# Patient Record
Sex: Male | Born: 1970 | Race: Black or African American | Hispanic: No | Marital: Married | Smoking: Never smoker
Health system: Southern US, Community
[De-identification: ages and names within clinical notes are randomized; demographics above are authoritative.]

## PROBLEM LIST (undated history)

## (undated) DIAGNOSIS — Z973 Presence of spectacles and contact lenses: Secondary | ICD-10-CM

## (undated) DIAGNOSIS — E785 Hyperlipidemia, unspecified: Secondary | ICD-10-CM

## (undated) DIAGNOSIS — G4700 Insomnia, unspecified: Secondary | ICD-10-CM

## (undated) DIAGNOSIS — I10 Essential (primary) hypertension: Secondary | ICD-10-CM

## (undated) HISTORY — DX: Hyperlipidemia, unspecified: E78.5

## (undated) HISTORY — DX: Essential (primary) hypertension: I10

## (undated) HISTORY — DX: Presence of spectacles and contact lenses: Z97.3

## (undated) HISTORY — DX: Insomnia, unspecified: G47.00

---

## 1998-01-16 ENCOUNTER — Encounter: Payer: Self-pay | Admitting: Internal Medicine

## 1998-01-16 ENCOUNTER — Inpatient Hospital Stay (HOSPITAL_COMMUNITY): Admission: EM | Admit: 1998-01-16 | Discharge: 1998-01-17 | Payer: Self-pay | Admitting: Emergency Medicine

## 2005-10-28 ENCOUNTER — Encounter: Admission: RE | Admit: 2005-10-28 | Discharge: 2005-10-28 | Payer: Self-pay | Admitting: Occupational Medicine

## 2007-05-10 ENCOUNTER — Inpatient Hospital Stay (HOSPITAL_COMMUNITY): Admission: EM | Admit: 2007-05-10 | Discharge: 2007-05-13 | Payer: Self-pay | Admitting: Emergency Medicine

## 2007-05-10 HISTORY — PX: INCISION AND DRAINAGE: SHX5863

## 2007-05-12 HISTORY — PX: INCISION AND DRAINAGE OF WOUND: SHX1803

## 2010-08-27 NOTE — Consult Note (Signed)
Patrick Werner                 ACCOUNT NO.:  [id number]   MEDICAL RECORD NO.:  [id number]          PATIENT TYPE:  INP   LOCATION:  2550                         FACILITY:  [facility]   PHYSICIAN:  Artist Pais. Weingold, M.D.DATE OF BIRTH:  April 17, 1970   DATE OF CONSULTATION:  05/10/2007  DATE OF DISCHARGE:                                 CONSULTATION   REASON FOR CONSULTATION:  Infected left thumb.   HISTORY OF PRESENT ILLNESS:  '  [name] is a 40 year old right-hand dominant male who presets  here with a 48-hour history of pain and swelling along the extensor  sheath and metacarpal phalangeal joint of his nondominant left thumb and  with a positive history of an insect bite/laceration in the last 48 to  72 hours.  Otherwise healthy 40 year old male.   ALLERGIES:  No known drug allergies.   MEDICATIONS:  His only medication right now is Lipitor for  hypercholesterolemia.   __________   FAMILY MEDICAL HISTORY:  Noncontributory.   SOCIAL HISTORY:  Noncontributory.   PHYSICAL EXAMINATION:  GENERAL:  A well-nourished male.  Pleasant and  alert and oriented times 3.  EXTREMITIES:  Examination of his hand on the left, he has pain and  swelling in the area of the mid metacarpal out to the tip of the thumb  just proximal to the eponychial fold.  He has no pain of the flexor  sheath but his metacarpal phalangeal joint is painful and swollen as  well as the entire extensor sheath.  He had an attempted I and D done at  that facility where a small amount of pus was encountered but he clearly  has an infected extensor sheath and probable septic metacarpal  phalangeal joint.  X-rays by history are negative from the outside  facility.  He is otherwise healthy.  No other significant findings.   IMPRESSION:  A 40 year old male with probable septic metacarpal  phalangeal joint, nondominant left thumb as well as an infected extensor  sheath.   RECOMMENDATIONS:  At this point in time,  will take him to the operating  room for I and D, culturing.  Will start him on vancomycin for a  presumptive methicillin resistant staphylococcus infection and will  probably do repeat wash outs as necessary.  He will be admitted for IV  antibiotics.  He understands the need for this course of action  emergently and again will take him to the operating room as soon as  possible __________  procedure.     Artist Pais Mina Marble, M.D.  Electronically Signed    MAW/MEDQ  D:  05/10/2007  T:  05/11/2007  Job:  [id number]

## 2010-08-27 NOTE — Op Note (Signed)
NAMERUSHIL, KIMBRELL                 ACCOUNT NO.:  [id number]   MEDICAL RECORD NO.:  [id number]          PATIENT TYPE:  INP   LOCATION:  2550                         FACILITY:  [facility]   PHYSICIAN:  Artist Pais. Weingold, M.D.DATE OF BIRTH:  [birth year]   DATE OF PROCEDURE:  05/10/2007  DATE OF DISCHARGE:                               OPERATIVE REPORT   PREOPERATIVE DIAGNOSIS:  Left thumb infection.   POSTOPERATIVE DIAGNOSIS:  Left thumb infection.   PROCEDURE:  Incision and drainage left thumb extensor sheath and  metacarpophalangeal joint.   SURGEON:  Artist Pais. Mina Marble, M.D.   ASSISTANT:  None.   ANESTHESIA:  General.   TOURNIQUET TIME:  16 minutes.   COMPLICATIONS:  None.   DRAINS:  None.   PACKS:  Wound was packed open with 1/4-inch Iodoform.   SPECIMEN:  Cultures x2 were sent.   OPERATIVE REPORT:  The patient taken to operating suite after the  induction of adequate general anesthesia.  The left upper extremity was  prepped and draped in the usual sterile fashion.  An Esmarch was used to  exsanguinate the limb.  The tourniquet was then inflated to 250 mm.  At  this point in time, a midline incision was made in the left thumb from  the IP joint to the level of the proximal aspect of the metacarpal.  Skin was incised.  Purulence was encountered over the extensor sheath.  This was cultured for both aerobic and anaerobic bacteria.  The sheath  was completely debrided.  A small incision was made on the ulnar side of  the metacarpophalangeal joint.  The joint was entered dorsoulnarly and  there was purulence inside the joint.  All devitalized tissue was  removed with a rongeur.  The wound was then thoroughly irrigated with a  3-liter pulse lavage irrigation.  It was then packed open with 1/4-inch  Iodoform gauze and secured with 2 or 3 nylon sutures to approximate the  wound edges.  Then 4 x 4's, fluffs and a compression wrap was applied.  The patient tolerated well, went to  recovery in stable fashion.     Artist Pais Mina Marble, M.D.  Electronically Signed    MAW/MEDQ  D:  05/10/2007  T:  05/11/2007  Job:  [id number]

## 2010-08-27 NOTE — Op Note (Signed)
NAMETREYVION, Werner                 ACCOUNT NO.:  [id number]   MEDICAL RECORD NO.:  [id number]          PATIENT TYPE:  INP   LOCATION:  5039                         FACILITY:  [facility]   PHYSICIAN:  Artist Pais. Weingold, M.D.DATE OF BIRTH:  08-Jun-1970   DATE OF PROCEDURE:  05/12/2007  DATE OF DISCHARGE:                               OPERATIVE REPORT   PREOPERATIVE DIAGNOSIS:  Left thumb septic metacarpal phalangeal joint  and extensor sheath infection.   POSTOPERATIVE DIAGNOSIS:  Left thumb septic metacarpal phalangeal joint  and extensor sheath infection.   PROCEDURE:  Repeat irrigation and debridement with secondary wound  closure.   SURGEON:  Artist Pais. Mina Marble, M.D.   ASSISTANT:  None.   ANESTHESIA:  General.   TOURNIQUET TIME:  17 minutes.   COMPLICATIONS:  None.   DRAINS:  None.   OPERATIVE REPORT:  The patient was taken to the operating suite, after  the induction of adequate general anesthesia, the left upper extremity  is prepped and draped in the usual sterile fashion.  An Esmarch was used  to exsanguinate the limb.  The tourniquet was inflated to 250 mmHg.  At  this point in time, the patient's old sutures were removed.  A midline  incision over the left thumb, which went basically from the IP joint to  the middle of the metacarpal area, was debrided using a rongeur.  All  devitalized tissue was removed.  At this point in time, 3 liters normal  saline was used to irrigate the wound using the pulse lavage irrigator.  After this was completed, the wound was loosely closed with 4-0 nylon.  4x4s, fluffs and a compressive wrap was applied.  The patient tolerated  the procedure well and went to the recovery room in stable condition.      Artist Pais Mina Marble, M.D.  Electronically Signed     MAW/MEDQ  D:  05/12/2007  T:  05/12/2007  Job:  [id number]

## 2010-08-30 NOTE — Discharge Summary (Signed)
NAMELEOTHA, VOELTZ                 ACCOUNT NO.:  [id number]   MEDICAL RECORD NO.:  [id number]          PATIENT TYPE:  INP   LOCATION:  5039                         FACILITY:  [facility]   PHYSICIAN:  Artist Pais. Weingold, M.D.DATE OF BIRTH:  1970/07/28   DATE OF ADMISSION:  05/10/2007  DATE OF DISCHARGE:  05/13/2007                               DISCHARGE SUMMARY   PRINCIPLE DIAGNOSIS:  Left thumb infection.   SECONDARY DIAGNOSIS:  Hypercholesteremia.   PRINCIPLE PROCEDURES:  Incision and drainage, left thumb x2 as well as  culturing.   HISTORY:  Mr. Bazar is a 40 year old right hand dominant male who  presented to the emergency department with a history of left thumb pain  and swelling with increasing pain forty-eight hours prior to admission.   PAST MEDICAL HISTORY:  Significant for hypercholesteremia.   FAMILY HISTORY:  Noncontributory.   SOCIAL HISTORY:  Noncontributory.   ALLERGIES:  No known drug allergies.   CURRENT MEDICATIONS:  Lipitor.   PHYSICAL EXAMINATION:  A tensely swollen left thumb dorsally over the  proximal phalangeal joint and pain of the extensor sheath. X-rays from  an outside facility were negative. He was taken to the operating room  that evening. We went to I&D left thumb extensor sheath of the  metacarpophalangeal joint. He was admitted to the floor. Cultures were  sent. He was started on vancomycin for presumptive methicillin resistant  staph infection. He was taken back to the operating room on May 12, 2007, where he underwent a repeat I&D and secondary wound closure. He  was discharged on the morning of May 13, 2007. His lab and culture  results eventually did grow out methicillin resistant Staph aureus, so  he was discharged on doxycycline for 3 weeks. He was to follow up in my  office the following Tuesday and was discharged with a doxycycline and  pain medication.      Artist Pais Mina Marble, M.D.  Electronically Signed     MAW/MEDQ  D:  06/30/2007  T:  06/30/2007  Job:  [id number]

## 2011-01-02 LAB — BASIC METABOLIC PANEL
BUN: 7
CO2: 30
Calcium: 8.8
Chloride: 100
Creatinine, Ser: 1.18
GFR calc Af Amer: >60
GFR calc non Af Amer: >60
Glucose, Bld: 113 — ABNORMAL HIGH
Potassium: 3.8
Sodium: 137

## 2011-01-02 LAB — GRAM STAIN

## 2011-01-02 LAB — DIFFERENTIAL
Basophils Absolute: 0
Basophils Relative: 0
Eosinophils Absolute: 0.2
Eosinophils Relative: 2
Lymphocytes Relative: 14
Lymphs Abs: 2
Monocytes Absolute: 0.8
Monocytes Relative: 6
Neutro Abs: 11 — ABNORMAL HIGH
Neutrophils Relative %: 78 — ABNORMAL HIGH

## 2011-01-02 LAB — CBC
HCT: 47.8
Hemoglobin: 16.4
MCHC: 34.3
MCV: 89.7
Platelets: 217
RBC: 5.33
RDW: 13.6
WBC: 14 — ABNORMAL HIGH

## 2011-01-02 LAB — ANAEROBIC CULTURE

## 2011-01-02 LAB — COMPREHENSIVE METABOLIC PANEL
ALT: 18
AST: 18
Albumin: 4.3
Alkaline Phosphatase: 78
BUN: 13
CO2: 30
Calcium: 9.4
Chloride: 95 — ABNORMAL LOW
Creatinine, Ser: 1.07
GFR calc Af Amer: >60
GFR calc non Af Amer: >60
Glucose, Bld: 104 — ABNORMAL HIGH
Potassium: 3.6
Sodium: 136
Total Bilirubin: 0.6
Total Protein: 7.6

## 2011-01-02 LAB — CULTURE, ROUTINE-ABSCESS

## 2015-08-09 DIAGNOSIS — I10 Essential (primary) hypertension: Secondary | ICD-10-CM | POA: Diagnosis not present

## 2015-08-09 DIAGNOSIS — E7800 Pure hypercholesterolemia, unspecified: Secondary | ICD-10-CM | POA: Diagnosis not present

## 2015-08-09 DIAGNOSIS — G4700 Insomnia, unspecified: Secondary | ICD-10-CM | POA: Diagnosis not present

## 2015-08-09 DIAGNOSIS — Z79899 Other long term (current) drug therapy: Secondary | ICD-10-CM | POA: Diagnosis not present

## 2015-08-09 DIAGNOSIS — Z0000 Encounter for general adult medical examination without abnormal findings: Secondary | ICD-10-CM | POA: Diagnosis not present

## 2016-02-15 DIAGNOSIS — L304 Erythema intertrigo: Secondary | ICD-10-CM | POA: Diagnosis not present

## 2016-06-12 DIAGNOSIS — H40021 Open angle with borderline findings, high risk, right eye: Secondary | ICD-10-CM | POA: Diagnosis not present

## 2016-06-12 DIAGNOSIS — H40012 Open angle with borderline findings, low risk, left eye: Secondary | ICD-10-CM | POA: Diagnosis not present

## 2016-06-12 DIAGNOSIS — Z83511 Family history of glaucoma: Secondary | ICD-10-CM | POA: Diagnosis not present

## 2016-10-30 DIAGNOSIS — G4700 Insomnia, unspecified: Secondary | ICD-10-CM | POA: Diagnosis not present

## 2016-10-30 DIAGNOSIS — I10 Essential (primary) hypertension: Secondary | ICD-10-CM | POA: Diagnosis not present

## 2016-10-30 DIAGNOSIS — E7800 Pure hypercholesterolemia, unspecified: Secondary | ICD-10-CM | POA: Diagnosis not present

## 2017-04-16 DIAGNOSIS — H40021 Open angle with borderline findings, high risk, right eye: Secondary | ICD-10-CM | POA: Diagnosis not present

## 2017-04-16 DIAGNOSIS — Z83511 Family history of glaucoma: Secondary | ICD-10-CM | POA: Diagnosis not present

## 2017-04-16 DIAGNOSIS — H40012 Open angle with borderline findings, low risk, left eye: Secondary | ICD-10-CM | POA: Diagnosis not present

## 2017-12-29 DIAGNOSIS — H40021 Open angle with borderline findings, high risk, right eye: Secondary | ICD-10-CM | POA: Diagnosis not present

## 2017-12-29 DIAGNOSIS — H40012 Open angle with borderline findings, low risk, left eye: Secondary | ICD-10-CM | POA: Diagnosis not present

## 2018-01-26 DIAGNOSIS — L0390 Cellulitis, unspecified: Secondary | ICD-10-CM | POA: Diagnosis not present

## 2018-01-26 DIAGNOSIS — G4700 Insomnia, unspecified: Secondary | ICD-10-CM | POA: Diagnosis not present

## 2018-12-02 DIAGNOSIS — I10 Essential (primary) hypertension: Secondary | ICD-10-CM | POA: Diagnosis not present

## 2018-12-02 DIAGNOSIS — G4700 Insomnia, unspecified: Secondary | ICD-10-CM | POA: Diagnosis not present

## 2018-12-02 DIAGNOSIS — E7800 Pure hypercholesterolemia, unspecified: Secondary | ICD-10-CM | POA: Diagnosis not present

## 2019-01-07 DIAGNOSIS — Z8249 Family history of ischemic heart disease and other diseases of the circulatory system: Secondary | ICD-10-CM | POA: Diagnosis not present

## 2019-01-07 DIAGNOSIS — I10 Essential (primary) hypertension: Secondary | ICD-10-CM | POA: Diagnosis not present

## 2019-01-07 DIAGNOSIS — E7800 Pure hypercholesterolemia, unspecified: Secondary | ICD-10-CM | POA: Diagnosis not present

## 2019-04-15 HISTORY — PX: COLONOSCOPY WITH PROPOFOL: SHX5780

## 2019-06-10 DIAGNOSIS — H40021 Open angle with borderline findings, high risk, right eye: Secondary | ICD-10-CM | POA: Diagnosis not present

## 2019-07-04 DIAGNOSIS — H40021 Open angle with borderline findings, high risk, right eye: Secondary | ICD-10-CM | POA: Diagnosis not present

## 2019-08-04 DIAGNOSIS — D2362 Other benign neoplasm of skin of left upper limb, including shoulder: Secondary | ICD-10-CM | POA: Diagnosis not present

## 2019-08-04 DIAGNOSIS — D1801 Hemangioma of skin and subcutaneous tissue: Secondary | ICD-10-CM | POA: Diagnosis not present

## 2020-01-05 DIAGNOSIS — E7800 Pure hypercholesterolemia, unspecified: Secondary | ICD-10-CM | POA: Diagnosis not present

## 2020-01-05 DIAGNOSIS — I10 Essential (primary) hypertension: Secondary | ICD-10-CM | POA: Diagnosis not present

## 2020-01-05 DIAGNOSIS — Z131 Encounter for screening for diabetes mellitus: Secondary | ICD-10-CM | POA: Diagnosis not present

## 2020-01-05 DIAGNOSIS — G4700 Insomnia, unspecified: Secondary | ICD-10-CM | POA: Diagnosis not present

## 2020-02-22 DIAGNOSIS — Z1211 Encounter for screening for malignant neoplasm of colon: Secondary | ICD-10-CM | POA: Diagnosis not present

## 2020-03-06 DIAGNOSIS — L720 Epidermal cyst: Secondary | ICD-10-CM | POA: Diagnosis not present

## 2020-03-06 DIAGNOSIS — L821 Other seborrheic keratosis: Secondary | ICD-10-CM | POA: Diagnosis not present

## 2020-03-15 DIAGNOSIS — Z1159 Encounter for screening for other viral diseases: Secondary | ICD-10-CM | POA: Diagnosis not present

## 2020-03-20 DIAGNOSIS — D123 Benign neoplasm of transverse colon: Secondary | ICD-10-CM | POA: Diagnosis not present

## 2020-03-20 DIAGNOSIS — K648 Other hemorrhoids: Secondary | ICD-10-CM | POA: Diagnosis not present

## 2020-03-20 DIAGNOSIS — K635 Polyp of colon: Secondary | ICD-10-CM | POA: Diagnosis not present

## 2020-03-20 DIAGNOSIS — Z1211 Encounter for screening for malignant neoplasm of colon: Secondary | ICD-10-CM | POA: Diagnosis not present

## 2020-03-20 DIAGNOSIS — D125 Benign neoplasm of sigmoid colon: Secondary | ICD-10-CM | POA: Diagnosis not present

## 2020-04-12 DIAGNOSIS — M9901 Segmental and somatic dysfunction of cervical region: Secondary | ICD-10-CM | POA: Diagnosis not present

## 2020-04-12 DIAGNOSIS — M9902 Segmental and somatic dysfunction of thoracic region: Secondary | ICD-10-CM | POA: Diagnosis not present

## 2020-04-12 DIAGNOSIS — M9903 Segmental and somatic dysfunction of lumbar region: Secondary | ICD-10-CM | POA: Diagnosis not present

## 2020-04-12 DIAGNOSIS — M9905 Segmental and somatic dysfunction of pelvic region: Secondary | ICD-10-CM | POA: Diagnosis not present

## 2020-06-08 DIAGNOSIS — H40021 Open angle with borderline findings, high risk, right eye: Secondary | ICD-10-CM | POA: Diagnosis not present

## 2020-07-17 DIAGNOSIS — M722 Plantar fascial fibromatosis: Secondary | ICD-10-CM | POA: Diagnosis not present

## 2020-07-17 DIAGNOSIS — M79672 Pain in left foot: Secondary | ICD-10-CM | POA: Diagnosis not present

## 2020-07-17 DIAGNOSIS — S93602A Unspecified sprain of left foot, initial encounter: Secondary | ICD-10-CM | POA: Diagnosis not present

## 2020-08-07 DIAGNOSIS — M722 Plantar fascial fibromatosis: Secondary | ICD-10-CM | POA: Diagnosis not present

## 2020-10-22 DIAGNOSIS — Z125 Encounter for screening for malignant neoplasm of prostate: Secondary | ICD-10-CM | POA: Diagnosis not present

## 2020-10-22 DIAGNOSIS — E7800 Pure hypercholesterolemia, unspecified: Secondary | ICD-10-CM | POA: Diagnosis not present

## 2020-10-22 DIAGNOSIS — R7303 Prediabetes: Secondary | ICD-10-CM | POA: Diagnosis not present

## 2020-10-22 DIAGNOSIS — I10 Essential (primary) hypertension: Secondary | ICD-10-CM | POA: Diagnosis not present

## 2020-12-13 DIAGNOSIS — R972 Elevated prostate specific antigen [PSA]: Secondary | ICD-10-CM | POA: Diagnosis not present

## 2021-01-12 DIAGNOSIS — C61 Malignant neoplasm of prostate: Secondary | ICD-10-CM

## 2021-01-12 HISTORY — DX: Malignant neoplasm of prostate: C61

## 2021-01-31 DIAGNOSIS — C61 Malignant neoplasm of prostate: Secondary | ICD-10-CM | POA: Diagnosis not present

## 2021-01-31 DIAGNOSIS — R972 Elevated prostate specific antigen [PSA]: Secondary | ICD-10-CM | POA: Diagnosis not present

## 2021-02-08 DIAGNOSIS — C61 Malignant neoplasm of prostate: Secondary | ICD-10-CM | POA: Diagnosis not present

## 2021-02-28 DIAGNOSIS — H40021 Open angle with borderline findings, high risk, right eye: Secondary | ICD-10-CM | POA: Diagnosis not present

## 2021-03-01 DIAGNOSIS — M792 Neuralgia and neuritis, unspecified: Secondary | ICD-10-CM | POA: Diagnosis not present

## 2021-07-09 ENCOUNTER — Other Ambulatory Visit: Payer: Self-pay | Admitting: Urology

## 2021-07-09 DIAGNOSIS — C61 Malignant neoplasm of prostate: Secondary | ICD-10-CM

## 2021-08-02 DIAGNOSIS — C61 Malignant neoplasm of prostate: Secondary | ICD-10-CM | POA: Diagnosis not present

## 2021-08-20 ENCOUNTER — Ambulatory Visit
Admission: RE | Admit: 2021-08-20 | Discharge: 2021-08-20 | Disposition: A | Discharge status: Home / Self Care | Payer: Self-pay | Source: Ambulatory Visit | Attending: Urology | Admitting: Urology

## 2021-08-20 DIAGNOSIS — K4090 Unilateral inguinal hernia, without obstruction or gangrene, not specified as recurrent: Secondary | ICD-10-CM | POA: Diagnosis not present

## 2021-08-20 DIAGNOSIS — C61 Malignant neoplasm of prostate: Secondary | ICD-10-CM | POA: Diagnosis not present

## 2021-08-20 MED ORDER — GADOBENATE DIMEGLUMINE 529 MG/ML IV SOLN
17.0000 mL | Freq: Once | INTRAVENOUS | Status: AC | PRN
  Administered 2021-08-20: 17 mL via INTRAVENOUS

## 2021-08-29 DIAGNOSIS — C61 Malignant neoplasm of prostate: Secondary | ICD-10-CM | POA: Diagnosis not present

## 2021-11-27 ENCOUNTER — Other Ambulatory Visit: Payer: Self-pay | Admitting: Family Medicine

## 2021-11-27 ENCOUNTER — Other Ambulatory Visit (HOSPITAL_BASED_OUTPATIENT_CLINIC_OR_DEPARTMENT_OTHER): Payer: Self-pay | Admitting: Family Medicine

## 2021-11-27 DIAGNOSIS — I10 Essential (primary) hypertension: Secondary | ICD-10-CM | POA: Diagnosis not present

## 2021-11-27 DIAGNOSIS — C61 Malignant neoplasm of prostate: Secondary | ICD-10-CM | POA: Diagnosis not present

## 2021-11-27 DIAGNOSIS — E7800 Pure hypercholesterolemia, unspecified: Secondary | ICD-10-CM | POA: Diagnosis not present

## 2021-11-27 DIAGNOSIS — R7303 Prediabetes: Secondary | ICD-10-CM | POA: Diagnosis not present

## 2021-12-25 ENCOUNTER — Ambulatory Visit (HOSPITAL_BASED_OUTPATIENT_CLINIC_OR_DEPARTMENT_OTHER)
Admission: RE | Admit: 2021-12-25 | Discharge: 2021-12-25 | Disposition: A | Discharge status: Home / Self Care | Payer: BC Managed Care – PPO | Source: Ambulatory Visit | Attending: Family Medicine | Admitting: Family Medicine

## 2021-12-25 DIAGNOSIS — E7800 Pure hypercholesterolemia, unspecified: Secondary | ICD-10-CM | POA: Insufficient documentation

## 2022-03-14 DIAGNOSIS — C61 Malignant neoplasm of prostate: Secondary | ICD-10-CM | POA: Diagnosis not present

## 2022-03-28 DIAGNOSIS — I10 Essential (primary) hypertension: Secondary | ICD-10-CM | POA: Diagnosis not present

## 2022-06-19 DIAGNOSIS — I10 Essential (primary) hypertension: Secondary | ICD-10-CM | POA: Diagnosis not present

## 2022-06-19 DIAGNOSIS — E7800 Pure hypercholesterolemia, unspecified: Secondary | ICD-10-CM | POA: Diagnosis not present

## 2022-07-03 ENCOUNTER — Other Ambulatory Visit: Payer: Self-pay | Admitting: Urology

## 2022-07-03 DIAGNOSIS — C61 Malignant neoplasm of prostate: Secondary | ICD-10-CM

## 2022-07-17 DIAGNOSIS — H40021 Open angle with borderline findings, high risk, right eye: Secondary | ICD-10-CM | POA: Diagnosis not present

## 2022-08-19 ENCOUNTER — Other Ambulatory Visit: Payer: BC Managed Care – PPO

## 2022-09-22 DIAGNOSIS — C61 Malignant neoplasm of prostate: Secondary | ICD-10-CM | POA: Diagnosis not present

## 2022-09-25 ENCOUNTER — Ambulatory Visit
Admission: RE | Admit: 2022-09-25 | Discharge: 2022-09-25 | Disposition: A | Discharge status: Home / Self Care | Payer: BC Managed Care – PPO | Source: Ambulatory Visit | Attending: Urology | Admitting: Urology

## 2022-09-25 DIAGNOSIS — C61 Malignant neoplasm of prostate: Secondary | ICD-10-CM | POA: Diagnosis not present

## 2022-09-25 MED ORDER — GADOPICLENOL 0.5 MMOL/ML IV SOLN
8.0000 mL | Freq: Once | INTRAVENOUS | Status: AC | PRN
  Administered 2022-09-25: 8 mL via INTRAVENOUS

## 2022-09-26 DIAGNOSIS — H40021 Open angle with borderline findings, high risk, right eye: Secondary | ICD-10-CM | POA: Diagnosis not present

## 2022-10-02 DIAGNOSIS — C61 Malignant neoplasm of prostate: Secondary | ICD-10-CM | POA: Diagnosis not present

## 2022-10-03 ENCOUNTER — Other Ambulatory Visit (HOSPITAL_COMMUNITY): Payer: Self-pay | Admitting: Urology

## 2022-10-03 ENCOUNTER — Other Ambulatory Visit: Payer: Self-pay | Admitting: Urology

## 2022-10-03 DIAGNOSIS — C61 Malignant neoplasm of prostate: Secondary | ICD-10-CM

## 2022-10-06 ENCOUNTER — Encounter (HOSPITAL_BASED_OUTPATIENT_CLINIC_OR_DEPARTMENT_OTHER): Payer: Self-pay | Admitting: Urology

## 2022-10-06 NOTE — Progress Notes (Signed)
Spoke w/ via phone for pre-op interview--- pt Lab needs dos---- [employer], ekg               Lab results------ no COVID test -----patient states asymptomatic no test needed Arrive at ------- 1100 on 10-15-2022 NPO after MN NO Solid Food.  Clear liquids from MN until--- 1000 Med rec completed Medications to take morning of surgery ----- bystolic Diabetic medication ----- n/a Patient instructed no nail polish to be worn day of surgery Patient instructed to bring photo id and insurance card day of surgery Patient aware to have Driver (ride ) / caregiver    for 24 hours after surgery -- wife, Patrick Werner Patient Special Instructions ----- will do one fleet enema morning of surgery Pre-Op special Instructions ----- n/a Patient verbalized understanding of instructions that were given at this phone interview. Patient denies shortness of breath, chest pain, fever, cough at this phone interview.

## 2022-10-15 ENCOUNTER — Ambulatory Visit (HOSPITAL_BASED_OUTPATIENT_CLINIC_OR_DEPARTMENT_OTHER)
Admission: RE | Admit: 2022-10-15 | Discharge: 2022-10-15 | Disposition: A | Discharge status: Home / Self Care | Payer: BC Managed Care – PPO | Attending: Urology | Admitting: Urology

## 2022-10-15 ENCOUNTER — Ambulatory Visit (HOSPITAL_COMMUNITY)
Admission: RE | Admit: 2022-10-15 | Discharge: 2022-10-15 | Disposition: A | Discharge status: Home / Self Care | Payer: BC Managed Care – PPO | Source: Ambulatory Visit

## 2022-10-15 ENCOUNTER — Encounter (HOSPITAL_BASED_OUTPATIENT_CLINIC_OR_DEPARTMENT_OTHER)
Admission: RE | Disposition: A | Discharge status: Home / Self Care | Payer: Self-pay | Source: Home / Self Care | Attending: Urology

## 2022-10-15 ENCOUNTER — Ambulatory Visit (HOSPITAL_BASED_OUTPATIENT_CLINIC_OR_DEPARTMENT_OTHER): Payer: BC Managed Care – PPO | Admitting: Anesthesiology

## 2022-10-15 ENCOUNTER — Encounter (HOSPITAL_BASED_OUTPATIENT_CLINIC_OR_DEPARTMENT_OTHER): Payer: Self-pay | Admitting: Urology

## 2022-10-15 DIAGNOSIS — E7800 Pure hypercholesterolemia, unspecified: Secondary | ICD-10-CM | POA: Insufficient documentation

## 2022-10-15 DIAGNOSIS — C61 Malignant neoplasm of prostate: Secondary | ICD-10-CM | POA: Insufficient documentation

## 2022-10-15 DIAGNOSIS — Z79899 Other long term (current) drug therapy: Secondary | ICD-10-CM | POA: Insufficient documentation

## 2022-10-15 DIAGNOSIS — N410 Acute prostatitis: Secondary | ICD-10-CM | POA: Diagnosis not present

## 2022-10-15 DIAGNOSIS — N411 Chronic prostatitis: Secondary | ICD-10-CM | POA: Insufficient documentation

## 2022-10-15 DIAGNOSIS — D291 Benign neoplasm of prostate: Secondary | ICD-10-CM | POA: Diagnosis not present

## 2022-10-15 DIAGNOSIS — I10 Essential (primary) hypertension: Secondary | ICD-10-CM | POA: Insufficient documentation

## 2022-10-15 DIAGNOSIS — Z01818 Encounter for other preprocedural examination: Secondary | ICD-10-CM

## 2022-10-15 HISTORY — PX: PROSTATE BIOPSY: SHX241

## 2022-10-15 LAB — POCT I-STAT, CHEM 8
BUN: 24 mg/dL — ABNORMAL HIGH (ref 6–20)
Calcium, Ion: 1.13 mmol/L — ABNORMAL LOW (ref 1.15–1.40)
Chloride: 99 mmol/L (ref 98–111)
Creatinine, Ser: 1.2 mg/dL (ref 0.61–1.24)
Glucose, Bld: 95 mg/dL (ref 70–99)
HCT: 48 % (ref 39.0–52.0)
Hemoglobin: 16.3 g/dL (ref 13.0–17.0)
Potassium: 3.9 mmol/L (ref 3.5–5.1)
Sodium: 139 mmol/L (ref 135–145)
TCO2: 27 mmol/L (ref 22–32)

## 2022-10-15 SURGERY — BIOPSY, PROSTATE, RECTAL APPROACH, WITH US GUIDANCE
Anesthesia: Monitor Anesthesia Care

## 2022-10-15 MED ORDER — PROPOFOL 500 MG/50ML IV EMUL
INTRAVENOUS | Status: DC | PRN
  Administered 2022-10-15: 150 ug/kg/min via INTRAVENOUS

## 2022-10-15 MED ORDER — FENTANYL CITRATE (PF) 100 MCG/2ML IJ SOLN
INTRAMUSCULAR | Status: AC
  Filled 2022-10-15: qty 2

## 2022-10-15 MED ORDER — DEXMEDETOMIDINE HCL IN NACL 80 MCG/20ML IV SOLN
INTRAVENOUS | Status: DC | PRN
  Administered 2022-10-15 (×2): 4 ug via INTRAVENOUS

## 2022-10-15 MED ORDER — CIPROFLOXACIN IN D5W 400 MG/200ML IV SOLN
400.0000 mg | INTRAVENOUS | Status: AC
  Administered 2022-10-15: 400 mg via INTRAVENOUS

## 2022-10-15 MED ORDER — LACTATED RINGERS IV SOLN: INTRAVENOUS | Status: DC

## 2022-10-15 MED ORDER — ACETAMINOPHEN 500 MG PO TABS
1000.0000 mg | ORAL_TABLET | Freq: Once | ORAL | Status: AC
  Administered 2022-10-15: 1000 mg via ORAL

## 2022-10-15 MED ORDER — LIDOCAINE HCL 2 % IJ SOLN
INTRAMUSCULAR | Status: DC | PRN
  Administered 2022-10-15: 10 mL

## 2022-10-15 MED ORDER — MIDAZOLAM HCL 2 MG/2ML IJ SOLN
INTRAMUSCULAR | Status: AC
  Filled 2022-10-15: qty 2

## 2022-10-15 MED ORDER — MIDAZOLAM HCL 2 MG/2ML IJ SOLN
INTRAMUSCULAR | Status: DC | PRN
  Administered 2022-10-15: 2 mg via INTRAVENOUS

## 2022-10-15 MED ORDER — CIPROFLOXACIN IN D5W 400 MG/200ML IV SOLN
INTRAVENOUS | Status: AC
  Filled 2022-10-15: qty 200

## 2022-10-15 MED ORDER — ACETAMINOPHEN 500 MG PO TABS
ORAL_TABLET | ORAL | Status: AC
  Filled 2022-10-15: qty 2

## 2022-10-15 MED ORDER — FENTANYL CITRATE (PF) 100 MCG/2ML IJ SOLN
INTRAMUSCULAR | Status: DC | PRN
  Administered 2022-10-15: 25 ug via INTRAVENOUS

## 2022-10-15 MED ORDER — ONDANSETRON HCL 4 MG/2ML IJ SOLN
INTRAMUSCULAR | Status: DC | PRN
  Administered 2022-10-15: 4 mg via INTRAVENOUS

## 2022-10-15 SURGICAL SUPPLY — 12 items
CLOTH BEACON ORANGE TIMEOUT ST (SAFETY) ×1 IMPLANT
INST BIOPSY MAXCORE 18GX25 (NEEDLE) IMPLANT
INSTR BIOPSY MAXCORE 18GX20 (NEEDLE) IMPLANT
KIT TURNOVER CYSTO (KITS) ×1 IMPLANT
NDL SAFETY ECLIP 18X1.5 (MISCELLANEOUS) IMPLANT
NDL SPNL 22GX7 QUINCKE BK (NEEDLE) ×1 IMPLANT
NEEDLE SPNL 22GX7 QUINCKE BK (NEEDLE) ×1 IMPLANT
NS IRRIG 500ML POUR BTL (IV SOLUTION) ×1 IMPLANT
PAD PREP 24X48 CUFFED NSTRL (MISCELLANEOUS) ×1 IMPLANT
SLEEVE SCD COMPRESS KNEE MED (STOCKING) ×1 IMPLANT
SYR CONTROL 10ML LL (SYRINGE) IMPLANT
UNDERPAD 30X36 HEAVY ABSORB (UNDERPADS AND DIAPERS) ×2 IMPLANT

## 2022-10-15 NOTE — H&P (Signed)
Office Visit Report     10/02/2022   --------------------------------------------------------------------------------   Patrick Werner  MRN: [id number]  DOB: 05-16-1970, 52 year old Male  PRIMARY CARE:  Patrick Werner, [place]  PRIMARY CARE FAX:  [phone]  REFERRING:  Patrick Deer A. Liliane Shi, MD  PROVIDER:  Rhoderick Werner, M.D.  LOCATION:  [facility] [phone]     --------------------------------------------------------------------------------   CC/HPI: CC: Prostate cancer   HPI: [name] is a 52 year old male with T1c, grade 1 prostate cancer diagnosed in October 2022   PSA at diagnosis: 4.72 (10/2020).  Last PSA: 5.29 (07/2021)  Prostate volume: 40.3 cm   08/29/21: [name] is here today for a surveillance prostate biopsy. Recent prostate MRI showed no concerning PI-RADS lesions. No urinary or erection complaints today.   10/02/22: The patient is here today for a routine follow-up. PSA remains elevated, but stable. His most recent prostate MRI showed no identifiable lesions. Prostate volume is approximately 41 cm. He states that he is voiding without difficulty and denies interval UTIs, dysuria or hematuria. No new health issues. Doing well.   AUASS: 0  SHIM: 25     ALLERGIES: No Allergies    MEDICATIONS: Ambien 10 mg tablet  Benazepril Hcl  Multivitamin  Triamterene-Hydrochlorothiazid 37.5 mg-25 mg capsule  Zocor 20 mg tablet     GU PSH: Prostate Needle Biopsy - 08/29/2021, 01/31/2021 Vasectomy - 2008       PSH Notes: Surgery Of Male Genitalia Vasectomy     NON-GU PSH: Surgical Pathology, Gross And Microscopic Examination For Prostate Needle - 08/29/2021, 01/31/2021     GU PMH: Prostate Cancer - 08/29/2021, - 02/08/2021 Elevated PSA - 01/31/2021, - 12/13/2020      PMH Notes:  1898-04-14 00:00:00 - Note: Normal Routine History And Physical Adult   NON-GU PMH: Hypercholesterolemia Hypertension    FAMILY HISTORY: 1 Daughter -  Daughter 1 son - Son Coronary Artery Disease - Father Family Health Status Number - Runs In Family Hypertension - Father   SOCIAL HISTORY: Marital Status: Married Preferred Language: English; Ethnicity: Not Hispanic Or Latino; Race: Black or African American Current Smoking Status: Patient has never smoked.   Tobacco Use Assessment Completed: Used Tobacco in last 30 days? Social Drinker.  Drinks 1 caffeinated drink per day.     Notes: Alcohol Use, Tobacco Use, Marital History - Divorced, Caffeine Use, Occupation:   REVIEW OF SYSTEMS:    GU Review Male:   Patient denies frequent urination, hard to postpone urination, burning/ pain with urination, get up at night to urinate, leakage of urine, stream starts and stops, trouble starting your stream, have to strain to urinate , erection problems, and penile pain.  Gastrointestinal (Upper):   Patient denies nausea, vomiting, and indigestion/ heartburn.  Gastrointestinal (Lower):   Patient denies diarrhea and constipation.  Constitutional:   Patient denies fever, night sweats, weight loss, and fatigue.  Skin:   Patient denies skin rash/ lesion and itching.  Eyes:   Patient denies blurred vision and double vision.  Ears/ Nose/ Throat:   Patient denies sore throat and sinus problems.  Hematologic/Lymphatic:   Patient denies swollen glands and easy bruising.  Cardiovascular:   Patient denies leg swelling and chest pains.  Respiratory:   Patient denies cough and shortness of breath.  Endocrine:   Patient denies excessive thirst.  Musculoskeletal:   Patient denies back pain and joint pain.  Neurological:   Patient denies dizziness and headaches.  Psychologic:   Patient  denies depression and anxiety.   VITAL SIGNS: None   MULTI-SYSTEM PHYSICAL EXAMINATION:    Constitutional: Well-nourished. No physical deformities. Normally developed. Good grooming.  Neurologic / Psychiatric: Oriented to time, oriented to place, oriented to person. No  depression, no anxiety, no agitation.  Musculoskeletal: Normal gait and station of head and neck.     Complexity of Data:  Lab Test Review:   PSA  Records Review:   Pathology Reports, Previous Patient Records  X-Ray Review: MRI Prostate GSORAD: Reviewed Films. Reviewed Report. Discussed With Patient.     03/14/22 08/02/21 12/13/20 10/22/20  PSA  Total PSA 7.57 ng/mL 5.29 ng/mL 4.20 ng/mL 4.72 ng/ml  Free PSA 0.98 ng/mL     % Free PSA 13 % PSA       PROCEDURES:          Visit Complexity - G2211          Urinalysis Dipstick Dipstick Cont'd  Color: Yellow Bilirubin: Neg mg/dL  Appearance: Clear Ketones: Neg mg/dL  Specific Gravity: 1.610 Blood: Neg ery/uL  pH: 6.5 Protein: Neg mg/dL  Glucose: Neg mg/dL Urobilinogen: 0.2 mg/dL    Nitrites: Neg    Leukocyte Esterase: Neg leu/uL    ASSESSMENT:      ICD-10 Details  1 GU:   Prostate Cancer - C61 Chronic, Stable   PLAN:           Schedule Return Visit/Planned Activity: Next Available Appointment - Schedule Surgery          Document Letter(s):  Created for Patient: Clinical Summary         Notes:    -MRI and PSA results discussed with the patient and his wife. Plan for surveillance biopsy in the operating room in the coming weeks.

## 2022-10-15 NOTE — Op Note (Signed)
Operative Note  Preoperative diagnosis:  1.  Gleason 6 prostate cancer  Postoperative diagnosis: 1.  Gleason 6 prostate cancer  Procedure(s): 1.  Transrectal ultrasound-guided prostate biopsy  Surgeon: Rhoderick Moody, MD  Assistants:  None  Anesthesia:  General  Complications:  None  EBL: Less than 10 mL  Specimens: 1.  12 needle core biopsies of the prostate  Drains/Catheters: 1.  None  Intraoperative findings:   Prostate volume of 39 cm  Indication:  Patrick Werner is a 52 y.o. male with a history of Gleason 6/grade 1 prostate cancer that was diagnosed in 2022 and he is currently under active surveillance.  He is today here today for routine surveillance biopsy.  He has been consented for the above procedures, voices understanding and wishes to proceed.  Description of procedure:  After informed consent was obtained, the patient was brought to the operating room and general MAC anesthesia was administered.  The patient was placed in the right lateral decubitus position and prepped in usual fashion.  A timeout was then performed.  Transrectal ultrasound probe was then inserted and through the rectum and the prostate was visualized.  Prostate volume was found to be 39 cm.  10 cc of 1% lidocaine without epinephrine was then injected around the prostate for local anesthetic.  A total of 12 needle core biopsies were and then taken from each sextant of the prostate.  The patient tolerated the procedure well and was transferred to the postanesthesia in stable condition.  Plan: Follow-up on 10/23/2022 to discuss pathology results

## 2022-10-15 NOTE — Anesthesia Preprocedure Evaluation (Addendum)
Anesthesia Evaluation  Patient identified by MRN, date of birth, ID band Patient awake    Reviewed: Allergy & Precautions, NPO status , Patient's Chart, lab work & pertinent test results, reviewed documented beta blocker date and time   Airway Mallampati: III  TM Distance: >3 FB Neck ROM: Full    Dental  (+) Teeth Intact, Dental Advisory Given   Pulmonary neg pulmonary ROS   Pulmonary exam normal breath sounds clear to auscultation       Cardiovascular hypertension (146/81 preop), Pt. on medications and Pt. on home beta blockers Normal cardiovascular exam Rhythm:Regular Rate:Normal     Neuro/Psych negative neurological ROS  negative psych ROS   GI/Hepatic negative GI ROS,,,(+)       alcohol use7 drinks/wk   Endo/Other  negative endocrine ROS    Renal/GU negative Renal ROS   Prostate ca    Musculoskeletal negative musculoskeletal ROS (+)    Abdominal   Peds  Hematology negative hematology ROS (+)   Anesthesia Other Findings   Reproductive/Obstetrics negative OB ROS                             Anesthesia Physical Anesthesia Plan  ASA: 2  Anesthesia Plan: MAC   Post-op Pain Management:    Induction:   PONV Risk Score and Plan: 2 and Propofol infusion and TIVA  Airway Management Planned: Natural Airway and Simple Face Mask  Additional Equipment: None  Intra-op Plan:   Post-operative Plan:   Informed Consent: I have reviewed the patients History and Physical, chart, labs and discussed the procedure including the risks, benefits and alternatives for the proposed anesthesia with the patient or authorized representative who has indicated his/her understanding and acceptance.       Plan Discussed with: CRNA  Anesthesia Plan Comments:        Anesthesia Quick Evaluation

## 2022-10-15 NOTE — Transfer of Care (Signed)
Immediate Anesthesia Transfer of Care Note  Patient: Patrick Werner  Procedure(s) Performed: BIOPSY TRANSRECTAL ULTRASONIC PROSTATE (TUBP)  Patient Location: PACU  Anesthesia Type:MAC  Level of Consciousness: awake and patient cooperative  Airway & Oxygen Therapy: Patient Spontanous Breathing and Patient connected to face mask oxygen  Post-op Assessment: Report given to RN and Post -op Vital signs reviewed and stable  Post vital signs: Reviewed and stable  Last Vitals:  Vitals Value Taken Time  BP    Temp    Pulse 63 10/15/22 1225  Resp 17 10/15/22 1225  SpO2 100 % 10/15/22 1225  Vitals shown include unvalidated device data.  Last Pain:  Vitals:   10/15/22 1113  TempSrc: Oral  PainSc: 0-No pain      Patients Stated Pain Goal: 4 (10/15/22 1113)  Complications: No notable events documented.

## 2022-10-15 NOTE — Discharge Instructions (Signed)
No acetaminophen/Tylenol until after 5:00pm today if needed for pain.     Post Anesthesia Home Care Instructions  Activity: Get plenty of rest for the remainder of the day. A responsible individual must stay with you for 24 hours following the procedure.  For the next 24 hours, DO NOT: -Drive a car -Operate machinery -Drink alcoholic beverages -Take any medication unless instructed by your physician -Make any legal decisions or sign important papers.  Meals: Start with liquid foods such as gelatin or soup. Progress to regular foods as tolerated. Avoid greasy, spicy, heavy foods. If nausea and/or vomiting occur, drink only clear liquids until the nausea and/or vomiting subsides. Call your physician if vomiting continues.  Special Instructions/Symptoms: Your throat may feel dry or sore from the anesthesia or the breathing tube placed in your throat during surgery. If this causes discomfort, gargle with warm salt water. The discomfort should disappear within 24 hours.    

## 2022-10-15 NOTE — Anesthesia Postprocedure Evaluation (Signed)
Anesthesia Post Note  Patient: SEVAK CHENNAULT  Procedure(s) Performed: BIOPSY TRANSRECTAL ULTRASONIC PROSTATE (TUBP)     Patient location during evaluation: PACU Anesthesia Type: MAC Level of consciousness: awake and alert Pain management: pain level controlled Vital Signs Assessment: post-procedure vital signs reviewed and stable Respiratory status: spontaneous breathing, nonlabored ventilation and respiratory function stable Cardiovascular status: blood pressure returned to baseline and stable Postop Assessment: no apparent nausea or vomiting Anesthetic complications: no   No notable events documented.  Last Vitals:  Vitals:   10/15/22 1256 10/15/22 1315  BP: 132/74 139/82  Pulse: (!) 53 (!) 47  Resp: 15 15  Temp: 36.4 C 36.4 C  SpO2: 96% 97%    Last Pain:  Vitals:   10/15/22 1315  TempSrc:   PainSc: 0-No pain                 Lannie Fields

## 2022-10-17 ENCOUNTER — Encounter (HOSPITAL_BASED_OUTPATIENT_CLINIC_OR_DEPARTMENT_OTHER): Payer: Self-pay | Admitting: Urology

## 2022-10-17 LAB — SURGICAL PATHOLOGY

## 2022-10-23 DIAGNOSIS — C61 Malignant neoplasm of prostate: Secondary | ICD-10-CM | POA: Diagnosis not present

## 2023-01-07 DIAGNOSIS — R7303 Prediabetes: Secondary | ICD-10-CM | POA: Diagnosis not present

## 2023-01-07 DIAGNOSIS — E7800 Pure hypercholesterolemia, unspecified: Secondary | ICD-10-CM | POA: Diagnosis not present

## 2023-01-07 DIAGNOSIS — Z0000 Encounter for general adult medical examination without abnormal findings: Secondary | ICD-10-CM | POA: Diagnosis not present

## 2023-01-07 DIAGNOSIS — G4700 Insomnia, unspecified: Secondary | ICD-10-CM | POA: Diagnosis not present

## 2023-01-07 DIAGNOSIS — I10 Essential (primary) hypertension: Secondary | ICD-10-CM | POA: Diagnosis not present

## 2023-03-02 DIAGNOSIS — L821 Other seborrheic keratosis: Secondary | ICD-10-CM | POA: Diagnosis not present

## 2023-03-02 DIAGNOSIS — L810 Postinflammatory hyperpigmentation: Secondary | ICD-10-CM | POA: Diagnosis not present

## 2023-04-20 DIAGNOSIS — C61 Malignant neoplasm of prostate: Secondary | ICD-10-CM | POA: Diagnosis not present

## 2023-04-28 DIAGNOSIS — H40021 Open angle with borderline findings, high risk, right eye: Secondary | ICD-10-CM | POA: Diagnosis not present

## 2023-05-19 DIAGNOSIS — C61 Malignant neoplasm of prostate: Secondary | ICD-10-CM | POA: Diagnosis not present

## 2023-05-19 DIAGNOSIS — N5201 Erectile dysfunction due to arterial insufficiency: Secondary | ICD-10-CM | POA: Diagnosis not present

## 2023-06-24 DIAGNOSIS — I10 Essential (primary) hypertension: Secondary | ICD-10-CM | POA: Diagnosis not present

## 2023-06-24 DIAGNOSIS — R600 Localized edema: Secondary | ICD-10-CM | POA: Diagnosis not present

## 2023-06-24 DIAGNOSIS — Z6828 Body mass index (BMI) 28.0-28.9, adult: Secondary | ICD-10-CM | POA: Diagnosis not present

## 2023-07-15 IMAGING — MR MR PROSTATE WO/W CM
12 series · 48 of 48 positions shown · IV contrast (multihance)
Comparison: None Available.

CLINICAL DATA: Prostate carcinoma, Gleason score 6. Active
surveillance.

EXAM:
MR PROSTATE WITHOUT AND WITH CONTRAST
TECHNIQUE: Multiplanar multisequence MRI images were obtained of the pelvis
centered about the prostate. Pre and post contrast images were
obtained.
CONTRAST:  17mL MULTIHANCE GADOBENATE DIMEGLUMINE 529 MG/ML IV SOLN

[Series 3: T2 · coronal · 3.0mm · 0.56mm/px · 1 of 23 slices shown (1 of 3)]
[im 1/23]
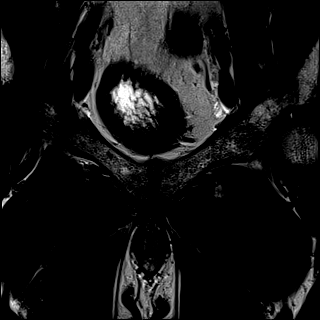

[Series 4: T1 · axial · 5.0mm · 1.25mm/px · 1 of 80 slices shown]
[im 1/80]
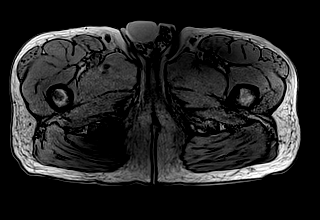

[Series 5: DWI · axial · 3.0mm · 1.75mm/px · z∈[+107,+164]mm · 2 of 60 slices shown (1 of 3)]
[im 1/60]
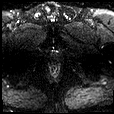
[im 60/60]
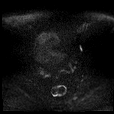

[Series 6: DWI · axial · 3.0mm · 1.75mm/px · 1 of 20 slices shown (2 of 3)]
[im 1/20]
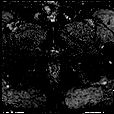

[Series 7: DWI · axial · 3.0mm · 1.75mm/px · 1 of 20 slices shown (3 of 3)]
[im 1/20]
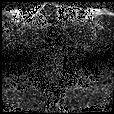

[Series 8: T2 · axial · 3.0mm · 0.56mm/px · 1 of 23 slices shown (2 of 3)]
[im 1/23]
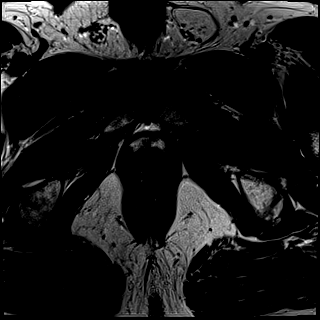

[Series 9: T2 · axial · 1.0mm · 1.04mm/px · z∈[+100,+171]mm · 2 of 72 slices shown (3 of 3)]
[im 1/72]
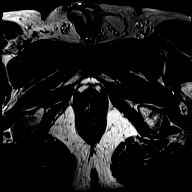
[im 72/72]
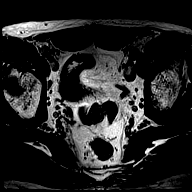

[Series 10: pre t1_twist_tra_dyn · axial · non-contrast · 3.5mm · 0.83mm/px · 1 of 20 slices shown]
[im 1/20]
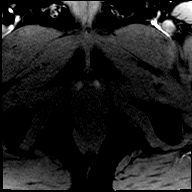

[Series 11: post t1_twist_tra_dyn-copy center · axial · non-contrast · 3.5mm · 0.83mm/px · z∈[+103,+169]mm · 17 of 600 slices shown]
[im 1/600]
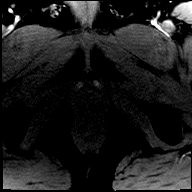
[im 38/600]
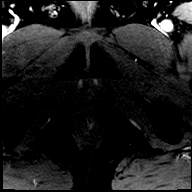
[im 75/600]
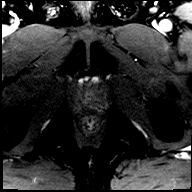
[im 113/600]
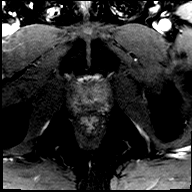
[im 150/600]
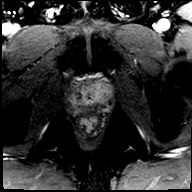
[im 188/600]
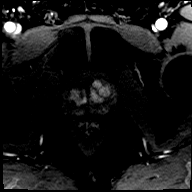
[im 225/600]
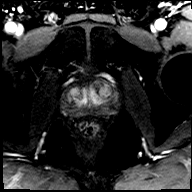
[im 263/600]
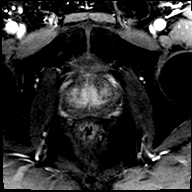
[im 300/600]
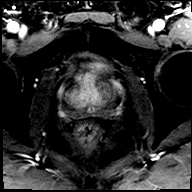
[im 337/600]
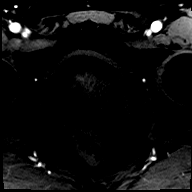
[im 375/600]
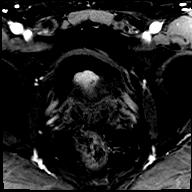
[im 412/600]
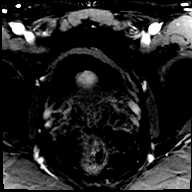
[im 450/600]
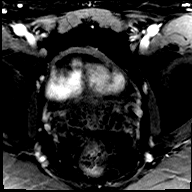
[im 487/600]
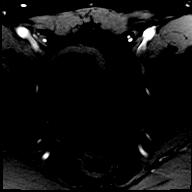
[im 525/600]
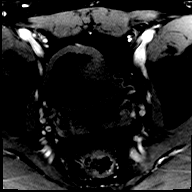
[im 562/600]
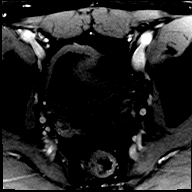
[im 600/600]
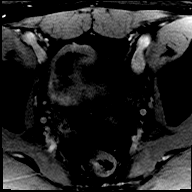

[Series 12: post t1_twist_tra_dyn-copy cent_sub · axial · 3.5mm · 0.83mm/px · z∈[+103,+169]mm · 17 of 580 slices shown]
[im 1/580]
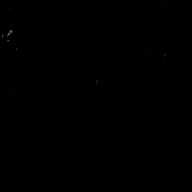
[im 37/580]
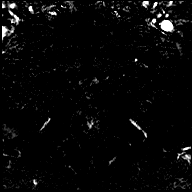
[im 73/580]
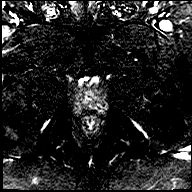
[im 109/580]
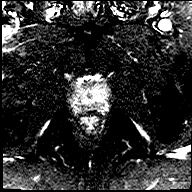
[im 145/580]
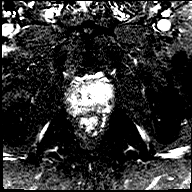
[im 181/580]
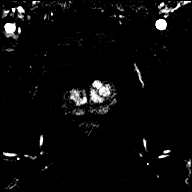
[im 218/580]
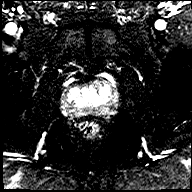
[im 254/580]
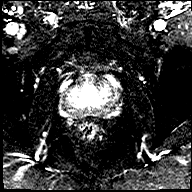
[im 290/580]
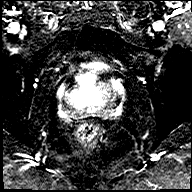
[im 326/580]
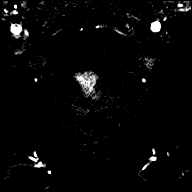
[im 362/580]
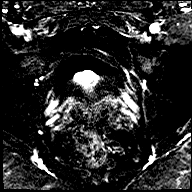
[im 399/580]
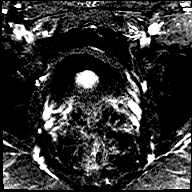
[im 435/580]
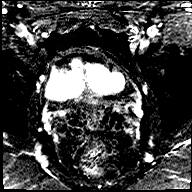
[im 471/580]
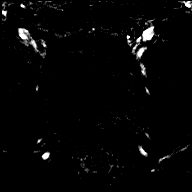
[im 507/580]
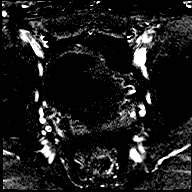
[im 543/580]
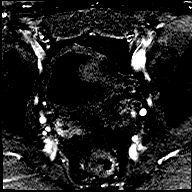
[im 580/580]
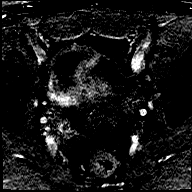

[Series 13: t1_vibe_dixon_tra_f · axial · 2.5mm · 0.91mm/px · z∈[+67,+264]mm · 2 of 80 slices shown]
[im 1/80]
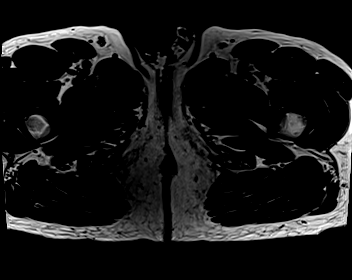
[im 80/80]
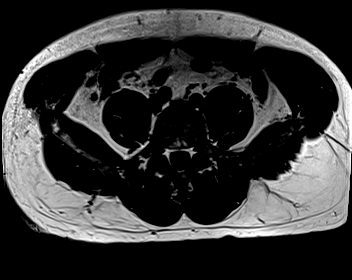

[Series 14: t1_vibe_dixon_tra_w · axial · 2.5mm · 0.91mm/px · z∈[+67,+264]mm · 2 of 80 slices shown]
[im 1/80]
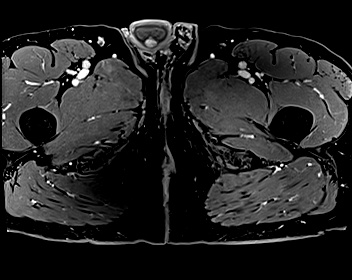
[im 80/80]
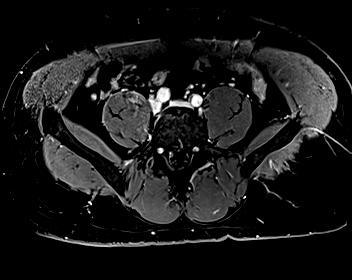

[48 of 48 positions shown; findings below may reference images not displayed]

FINDINGS: Prostate:

-- Peripheral Zone: No focal lesion seen on ADC or high b-value DWI
sequences.

-- Transition/Central Zone: Mildly enlarged with diffuse involvement
by BPH nodules. No nodules with suspicious characteristics on
T2-weighted imaging.

-- Measurements/Volume:  3.8 x 3.8 x 5.2 cm (volume = 39 cm^3)

Transcapsular spread:  Absent

Seminal vesicle involvement:  Absent

Neurovascular bundle involvement:  Absent

Pelvic adenopathy: None visualized

Bone metastasis: None visualized

Other:  Small left inguinal hernia, which contains only fat.
IMPRESSION: No radiographic evidence of high-grade prostate carcinoma. PI-RADS 1
(v2.1): Very Low (clinically significant cancer highly unlikely)

## 2023-07-16 DIAGNOSIS — D2362 Other benign neoplasm of skin of left upper limb, including shoulder: Secondary | ICD-10-CM | POA: Diagnosis not present

## 2023-07-16 DIAGNOSIS — L820 Inflamed seborrheic keratosis: Secondary | ICD-10-CM | POA: Diagnosis not present

## 2023-08-27 DIAGNOSIS — H40021 Open angle with borderline findings, high risk, right eye: Secondary | ICD-10-CM | POA: Diagnosis not present

## 2023-10-23 DIAGNOSIS — C61 Malignant neoplasm of prostate: Secondary | ICD-10-CM | POA: Diagnosis not present

## 2024-01-27 DIAGNOSIS — E7800 Pure hypercholesterolemia, unspecified: Secondary | ICD-10-CM | POA: Diagnosis not present

## 2024-01-27 DIAGNOSIS — Z0000 Encounter for general adult medical examination without abnormal findings: Secondary | ICD-10-CM | POA: Diagnosis not present

## 2024-01-27 DIAGNOSIS — Z23 Encounter for immunization: Secondary | ICD-10-CM | POA: Diagnosis not present

## 2024-01-27 DIAGNOSIS — G4700 Insomnia, unspecified: Secondary | ICD-10-CM | POA: Diagnosis not present

## 2024-01-27 DIAGNOSIS — I10 Essential (primary) hypertension: Secondary | ICD-10-CM | POA: Diagnosis not present

## 2024-01-27 DIAGNOSIS — R7303 Prediabetes: Secondary | ICD-10-CM | POA: Diagnosis not present

## 2024-03-07 ENCOUNTER — Other Ambulatory Visit: Payer: Self-pay | Admitting: Urology

## 2024-03-07 DIAGNOSIS — C61 Malignant neoplasm of prostate: Secondary | ICD-10-CM

## 2024-03-21 DIAGNOSIS — H40021 Open angle with borderline findings, high risk, right eye: Secondary | ICD-10-CM | POA: Diagnosis not present

## 2024-03-29 ENCOUNTER — Encounter: Payer: Self-pay | Admitting: Urology

## 2024-04-22 ENCOUNTER — Ambulatory Visit
Admission: RE | Admit: 2024-04-22 | Discharge: 2024-04-22 | Disposition: A | Source: Ambulatory Visit | Attending: Urology

## 2024-04-22 DIAGNOSIS — C61 Malignant neoplasm of prostate: Secondary | ICD-10-CM

## 2024-04-22 MED ORDER — GADOPICLENOL 0.5 MMOL/ML IV SOLN
7.5000 mL | Freq: Once | INTRAVENOUS | Status: AC | PRN
  Administered 2024-04-22: 7.5 mL via INTRAVENOUS
